# Patient Record
Sex: Female | Born: 1955 | Race: White | Hispanic: No | Marital: Married
Health system: Southern US, Community
[De-identification: ages and names within clinical notes are randomized; demographics above are authoritative.]

---

## 2019-08-05 ENCOUNTER — Encounter (HOSPITAL_COMMUNITY): Payer: Self-pay | Admitting: Emergency Medicine

## 2019-08-05 ENCOUNTER — Emergency Department (HOSPITAL_COMMUNITY)
Admission: EM | Admit: 2019-08-05 | Discharge: 2019-08-05 | Disposition: A | Discharge status: Home / Self Care | Payer: 59 | Attending: Emergency Medicine | Admitting: Emergency Medicine

## 2019-08-05 ENCOUNTER — Emergency Department (HOSPITAL_COMMUNITY): Payer: 59

## 2019-08-05 ENCOUNTER — Other Ambulatory Visit: Payer: Self-pay

## 2019-08-05 DIAGNOSIS — W07XXXA Fall from chair, initial encounter: Secondary | ICD-10-CM | POA: Insufficient documentation

## 2019-08-05 DIAGNOSIS — Y9389 Activity, other specified: Secondary | ICD-10-CM | POA: Insufficient documentation

## 2019-08-05 DIAGNOSIS — S4991XA Unspecified injury of right shoulder and upper arm, initial encounter: Secondary | ICD-10-CM | POA: Diagnosis present

## 2019-08-05 DIAGNOSIS — S42214A Unspecified nondisplaced fracture of surgical neck of right humerus, initial encounter for closed fracture: Secondary | ICD-10-CM | POA: Diagnosis not present

## 2019-08-05 DIAGNOSIS — S42201A Unspecified fracture of upper end of right humerus, initial encounter for closed fracture: Secondary | ICD-10-CM

## 2019-08-05 DIAGNOSIS — Z79899 Other long term (current) drug therapy: Secondary | ICD-10-CM | POA: Insufficient documentation

## 2019-08-05 DIAGNOSIS — Y92009 Unspecified place in unspecified non-institutional (private) residence as the place of occurrence of the external cause: Secondary | ICD-10-CM | POA: Diagnosis not present

## 2019-08-05 DIAGNOSIS — Y999 Unspecified external cause status: Secondary | ICD-10-CM | POA: Diagnosis not present

## 2019-08-05 MED ORDER — OXYCODONE-ACETAMINOPHEN 5-325 MG PO TABS: 1.0000 | ORAL_TABLET | Freq: Three times a day (TID) | ORAL | 0 refills | Status: AC | PRN

## 2019-08-05 MED ORDER — HYDROMORPHONE HCL 1 MG/ML IJ SOLN
1.0000 mg | Freq: Once | INTRAMUSCULAR | Status: AC
  Administered 2019-08-05: 1 mg via INTRAVENOUS
  Filled 2019-08-05: qty 1

## 2019-08-05 NOTE — Discharge Instructions (Addendum)
Take the pain medicine as needed to help with your symptoms. Use the shoulder immobilizer as directed. It is important for you to follow-up with the orthopedist listed below are one that you have seen in the past for further evaluation of this fracture. You can take ibuprofen and apply ice to help with symptoms as well. Return to the ER if you start to experience worsening pain, additional injuries, numbness, worsening swelling.

## 2019-08-05 NOTE — ED Triage Notes (Signed)
Pt BIBA.   Per EMS- Pt was adjusting curtain rod and had a mechanical fall, denies LOC. 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    Ambulatory, AOx4. Pt unable to perform ROM of right arm.  Distal PMS intact. Radiating pain down into hand.   20 g L FA; 100 mcg fentanyl (last given 1755). 450 mL NaCl given en route.

## 2019-08-05 NOTE — ED Provider Notes (Signed)
[facility]-EMERGENCY DEPT Provider Note   CSN: [id number] Arrival date & time: 08/05/19  1751     History Chief Complaint  Patient presents with  . Fall  . Shoulder Injury    right    Chloe Krueger is a 64 y.o. 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 States that she was at her mother in law's house just prior to arrival trying to fix one of the curtains in her home.  She climbed on top of a chair.  States that her feet slipped as she tried to move and she fell and landed on her right shoulder.  She denies any head injury, loss of consciousness.  She has been ambulatory since the fall.  She reports sharp pain in her right shoulder that is worse with movement and palpation.  Reports history of right shoulder pain in the past which improved with physical therapy.  She denies any headache, vision changes, anticoagulant use, back pain, neck pain, chest pain, numbness in arms or legs.  HPI     No past medical history on file.  There are no problems to display for this patient.   OB History   No obstetric history on file.     No family history on file.  Social History   Tobacco Use  . Smoking status: Not on file  Substance Use Topics  . Alcohol use: Not on file  . Drug use: Not on file    Home Medications Prior to Admission medications   Medication Sig Start Date End Date Taking? Authorizing Provider  Multiple Vitamins-Minerals (ONE-A-DAY VITACRAVES IMMUNITY PO) Take 1 tablet by mouth in the morning and at bedtime.   Yes [provider]  triamterene-hydrochlorothiazide (MAXZIDE-25) 37.5-25 MG tablet Take 1 tablet by mouth daily.  05/20/16  Yes [provider]  oxyCODONE-acetaminophen (PERCOCET/ROXICET) 5-325 MG tablet Take 1 tablet by mouth every 8 (eight) hours as needed for severe pain. 08/05/19   Lashika Erker, PA-C     Allergies    Codeine  Review of Systems   Review of Systems  Constitutional: Negative for appetite change, chills and fever.  HENT: Negative for ear pain, rhinorrhea, sneezing and sore throat.   Eyes: Negative for photophobia and visual disturbance.  Respiratory: Negative for cough, chest tightness, shortness of breath and wheezing.   Cardiovascular: Negative for chest pain and palpitations.  Gastrointestinal: Negative for abdominal pain, blood in stool, constipation, diarrhea, nausea and vomiting.  Genitourinary: Negative for dysuria, hematuria and urgency.  Musculoskeletal: Positive for arthralgias. Negative for myalgias.  Skin: Negative for rash.  Neurological: Negative for dizziness, weakness and light-headedness.    Physical Exam Updated Vital Signs BP 133/80 (BP Location: Left Arm)   Pulse 86   Temp 98.1 F (36.7 C) (Oral)   Resp 17   Ht 5\' 5"  (1.651 m)   Wt 88.5 kg   SpO2 99%   BMI 32.45 kg/m   Physical Exam Vitals and nursing note reviewed.  Constitutional:      General: She is not in acute distress.    Appearance: She is well-developed.  HENT:     Head: Normocephalic and atraumatic.     Nose: Nose normal.  Eyes:     General: No scleral icterus.       Left eye: No discharge.     Conjunctiva/sclera: Conjunctivae normal.  Cardiovascular:  Rate and Rhythm: Normal rate and regular rhythm.     Heart sounds: Normal heart sounds. No murmur. No friction rub. No gallop.   Pulmonary:     Effort: Pulmonary effort is normal. No respiratory distress.     Breath sounds: Normal breath sounds.  Abdominal:     General: Bowel sounds are normal. There is no distension.     Palpations: Abdomen is soft.     Tenderness: There is no abdominal tenderness. There is no guarding.  Musculoskeletal:     Right shoulder: Tenderness and bony tenderness present. Decreased range of motion.       Arms:     Cervical back: Normal range of motion and neck supple.     Comments: TTP  of R shoulder with limited ROM. No obvious deformities noted. 2+ radial pulse noted bilaterally. Normal sensation to light touch. FROM of R elbow, wrist and digits.  Skin:    General: Skin is warm and dry.     Findings: No rash.  Neurological:     Mental Status: She is alert.     Motor: No abnormal muscle tone.     Coordination: Coordination normal.     ED Results / Procedures / Treatments   Labs (all labs ordered are listed, but only abnormal results are displayed) Labs Reviewed - No data to display  EKG None  Radiology DG Shoulder Right  Result Date: 08/05/2019 CLINICAL DATA:  Status post fall, shoulder pain EXAM: RIGHT SHOULDER - 2+ VIEW; RIGHT HUMERUS - 2+ VIEW COMPARISON:  None. FINDINGS: Nondisplaced fracture of the surgical neck of the right proximal humerus. No other fracture or dislocation. Soft tissues are unremarkable. IMPRESSION: Nondisplaced fracture of the surgical neck of the right proximal humerus. Electronically Signed   By: Elige Ko   On: 08/05/2019 19:35   DG Humerus Right  Result Date: 08/05/2019 CLINICAL DATA:  Status post fall, shoulder pain EXAM: RIGHT SHOULDER - 2+ VIEW; RIGHT HUMERUS - 2+ VIEW COMPARISON:  None. FINDINGS: Nondisplaced fracture of the surgical neck of the right proximal humerus. No other fracture or dislocation. Soft tissues are unremarkable. IMPRESSION: Nondisplaced fracture of the surgical neck of the right proximal humerus. Electronically Signed   By: Elige Ko   On: 08/05/2019 19:35    Procedures Procedures (including critical care time)  Medications Ordered in ED Medications  HYDROmorphone (DILAUDID) injection 1 mg (1 mg Intravenous Given 08/05/19 1849)    ED Course  I have reviewed the triage vital signs and the nursing notes.  Pertinent labs & imaging results that were available during my care of the patient were reviewed by me and considered in my medical decision making (see chart for details).    MDM  Rules/Calculators/A&P                      64 year old female with a past medical history of Mnire's disease presenting to the ED after mechanical fall that occurred prior to arrival.  Slipped off of a chair while trying to fix the curtain in her mother-in-law's home and landed on her right shoulder.  She has been having pain with movement since then.  On exam there are tender to palpation of the right shoulder and proximal humerus without obvious deformity.  There is neurovascularly intact.  Good pulses noted.  Normal range of motion of elbow, wrist and digits.  No rib tenderness, no C, T or L-spine tenderness on exam.  She reports normal gait and denies  any head injury or loss of consciousness.  X-ray of the right arm and shoulder shows nondisplaced fracture of the right proximal humeral neck.  Patient will be given shoulder immobilizer, short course of pain medication and orthopedic follow-up.  We will have her return for worsening symptoms.  All imaging, if done today, including plain films, CT scans, and ultrasounds, independently reviewed by me, and interpretations confirmed via formal radiology reads.  Patient is hemodynamically stable, in NAD, and able to ambulate in the ED. Evaluation does not show pathology that would require ongoing emergent intervention or inpatient treatment. I explained the diagnosis to the patient. Pain has been managed and has no complaints prior to discharge. Patient is comfortable with above plan and is stable for discharge at this time. All questions were answered prior to disposition. Strict return precautions for returning to the ED were discussed. Encouraged follow up with PCP.   Prior to providing a prescription for a controlled substance, I independently reviewed the patient's recent prescription history on the [facility]. The patient had no recent or regular prescriptions and was deemed appropriate for a brief, less than 3  day prescription of narcotic for acute analgesia.  An After Visit Summary was printed and given to the patient.   Portions of this note were generated with [employer]. Dictation errors may occur despite best attempts at proofreading.  Final Clinical Impression(s) / ED Diagnoses Final diagnoses:  Closed fracture of proximal end of right humerus, unspecified fracture morphology, initial encounter    Rx / DC Orders ED Discharge Orders         Ordered    oxyCODONE-acetaminophen (PERCOCET/ROXICET) 5-325 MG tablet  Every 8 hours PRN     08/05/19 2008           Delia Heady, PA-C 08/05/19 2010    Drenda Freeze, MD 08/07/19 [phone]

## 2021-09-06 IMAGING — CR DG SHOULDER 2+V*R*
3 series · 3 of 3 positions shown · non-contrast
Comparison: None.

CLINICAL DATA: Status post fall, shoulder pain

EXAM:
RIGHT SHOULDER - 2+ VIEW; RIGHT HUMERUS - 2+ VIEW

[x shoulder ap right (1 of 3)]
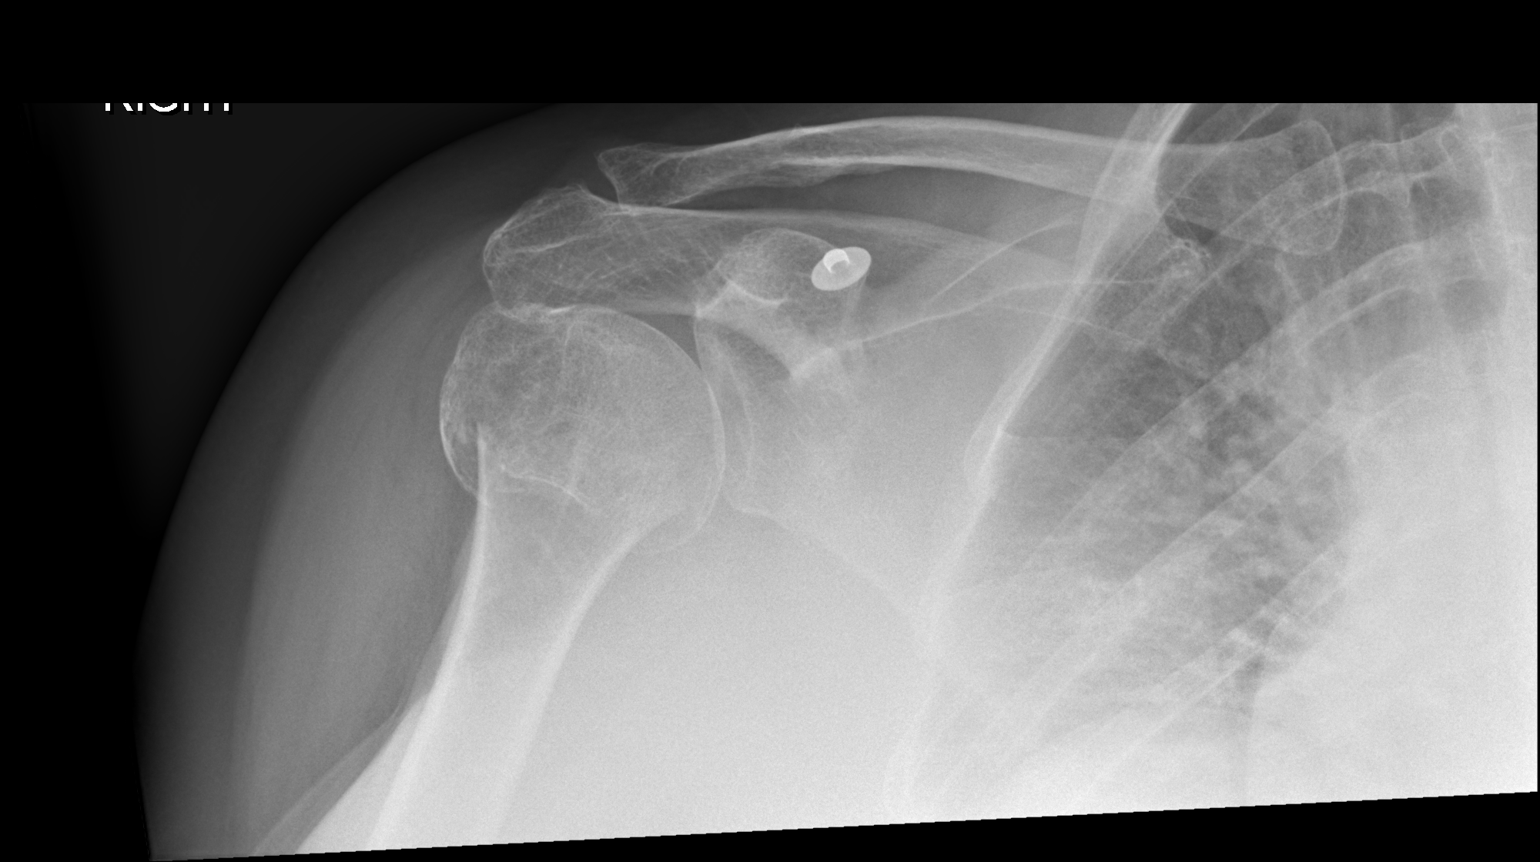

[x shoulder ap right (2 of 3)]
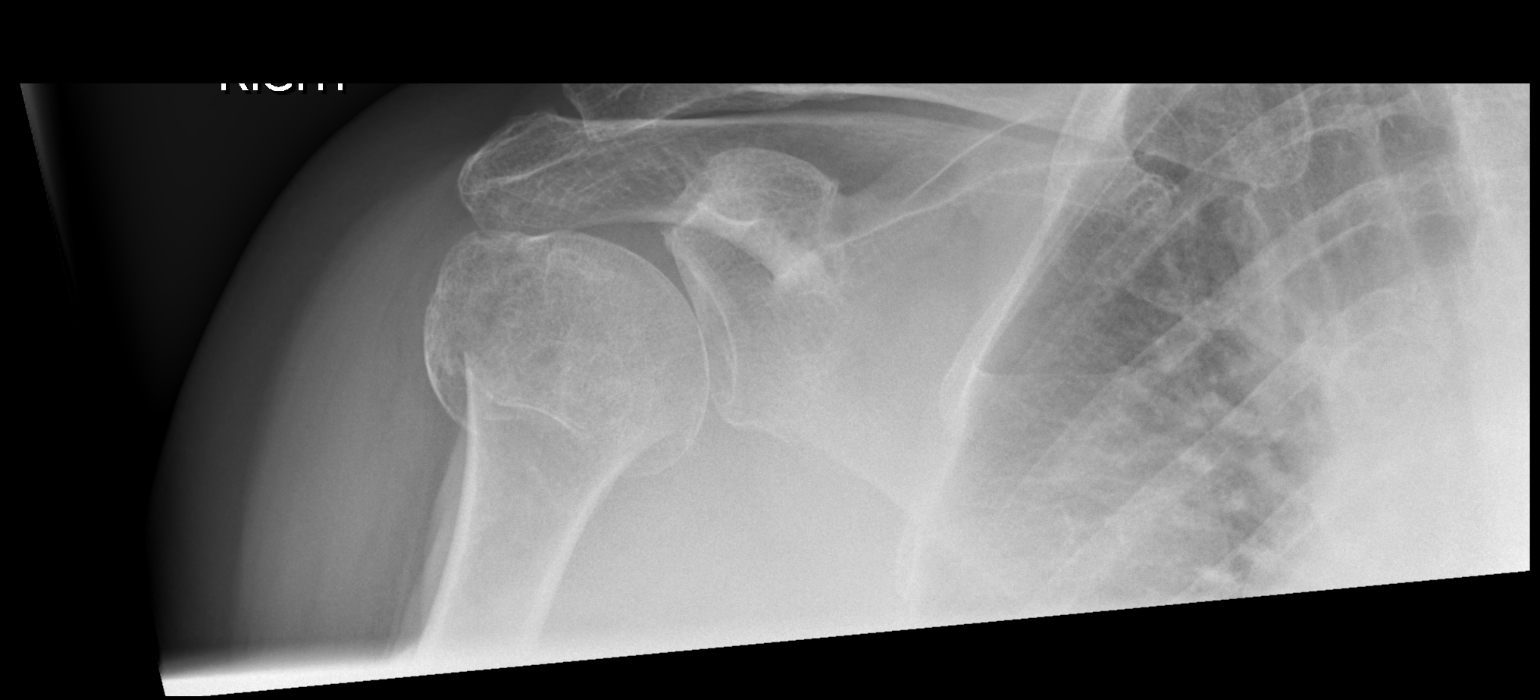

[x shoulder ap right (3 of 3)]
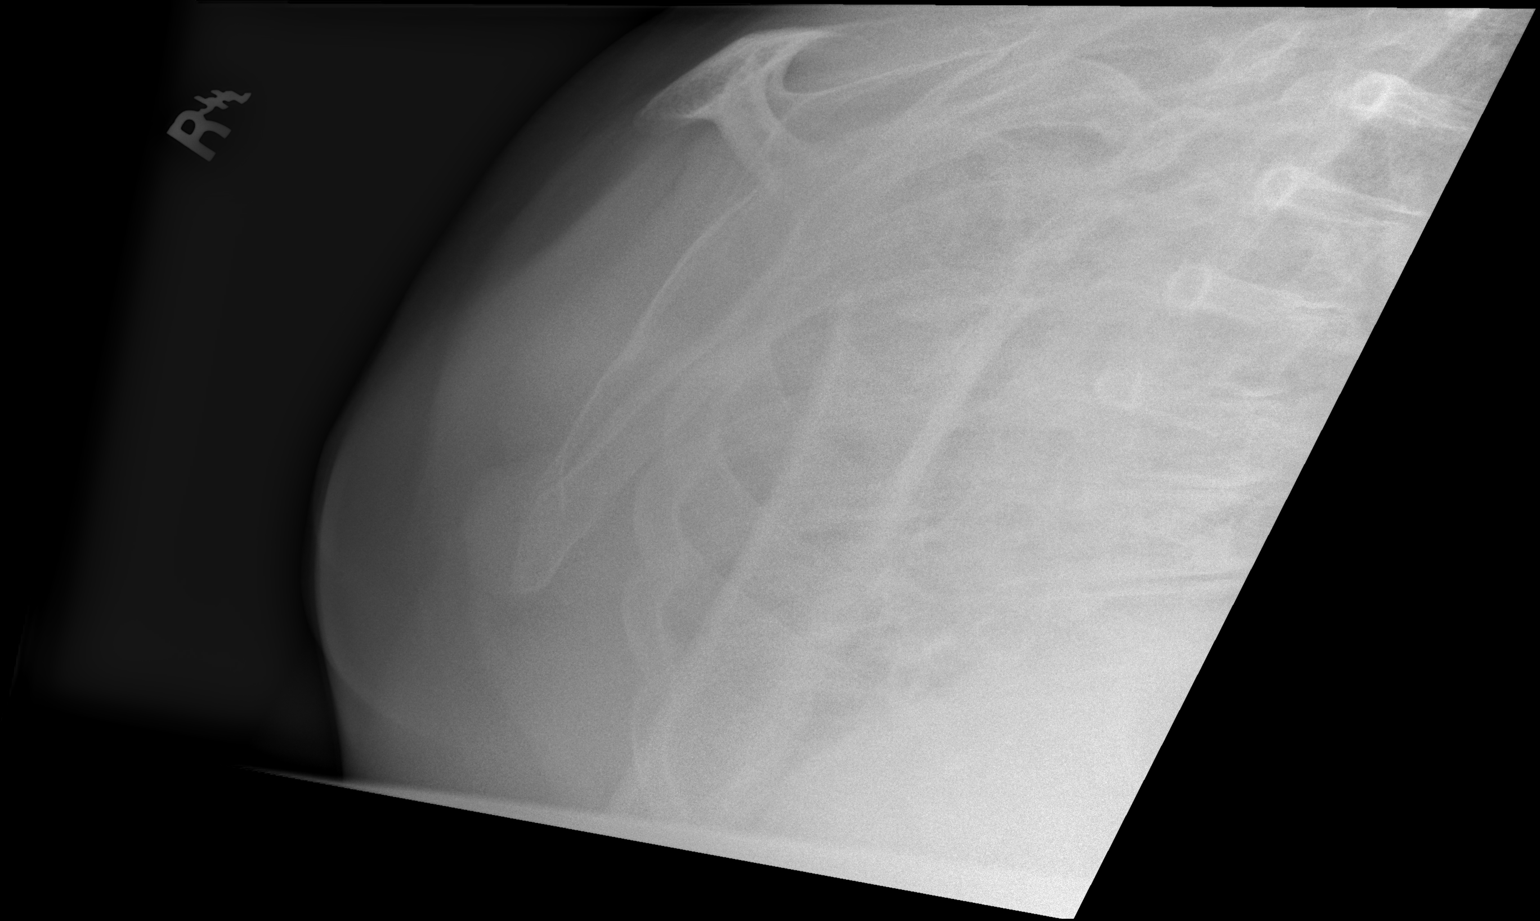

[3 of 3 positions shown; findings below may reference images not displayed]

FINDINGS: Nondisplaced fracture of the surgical neck of the right proximal
humerus. No other fracture or dislocation. Soft tissues are
unremarkable.
IMPRESSION: Nondisplaced fracture of the surgical neck of the right proximal
humerus.

## 2021-09-06 IMAGING — CR DG HUMERUS 2V *R*
3 series · 3 of 3 positions shown · non-contrast
Comparison: None.

CLINICAL DATA: Status post fall, shoulder pain

EXAM:
RIGHT SHOULDER - 2+ VIEW; RIGHT HUMERUS - 2+ VIEW

[x humerus ap right]
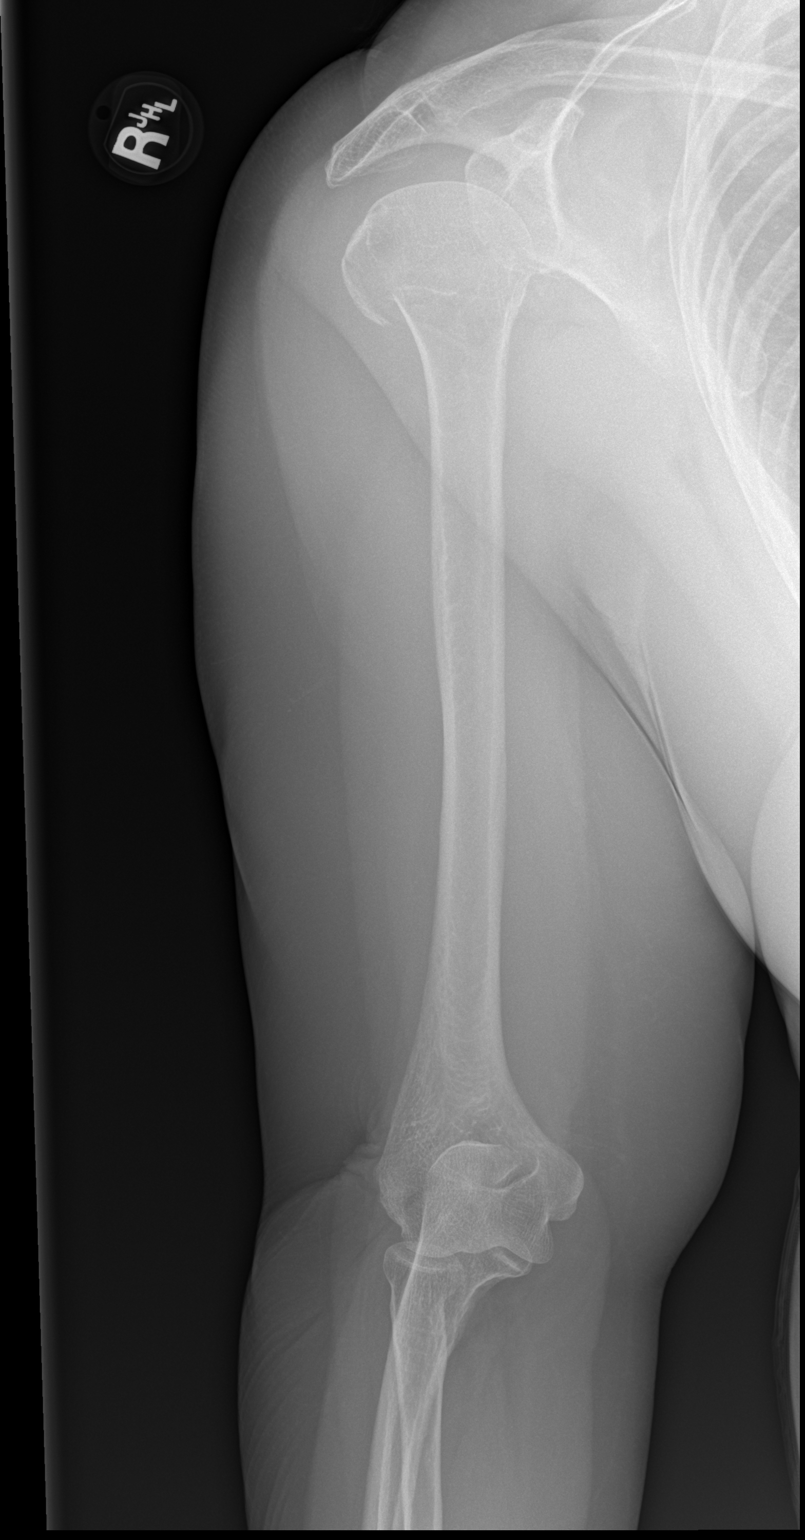

[x humerus lat right (1 of 2)]
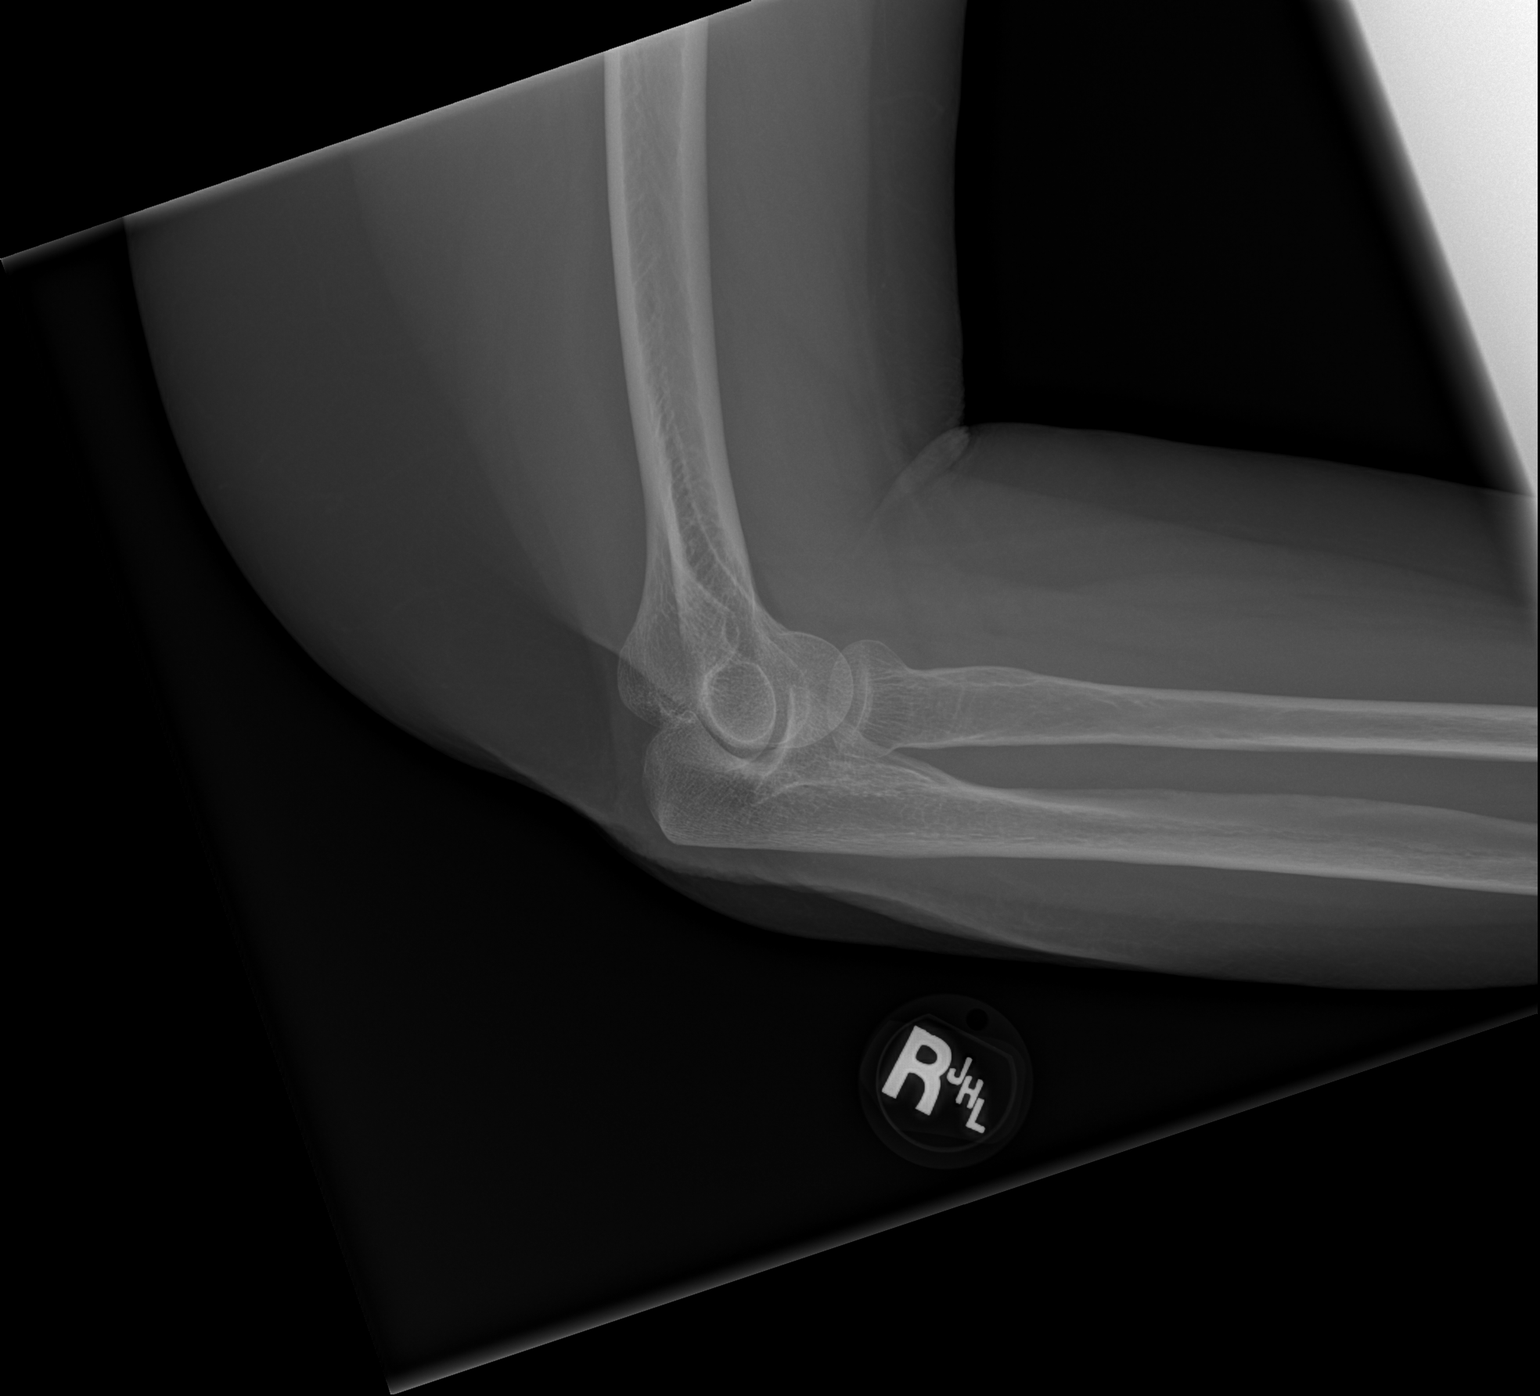

[x humerus lat right (2 of 2)]
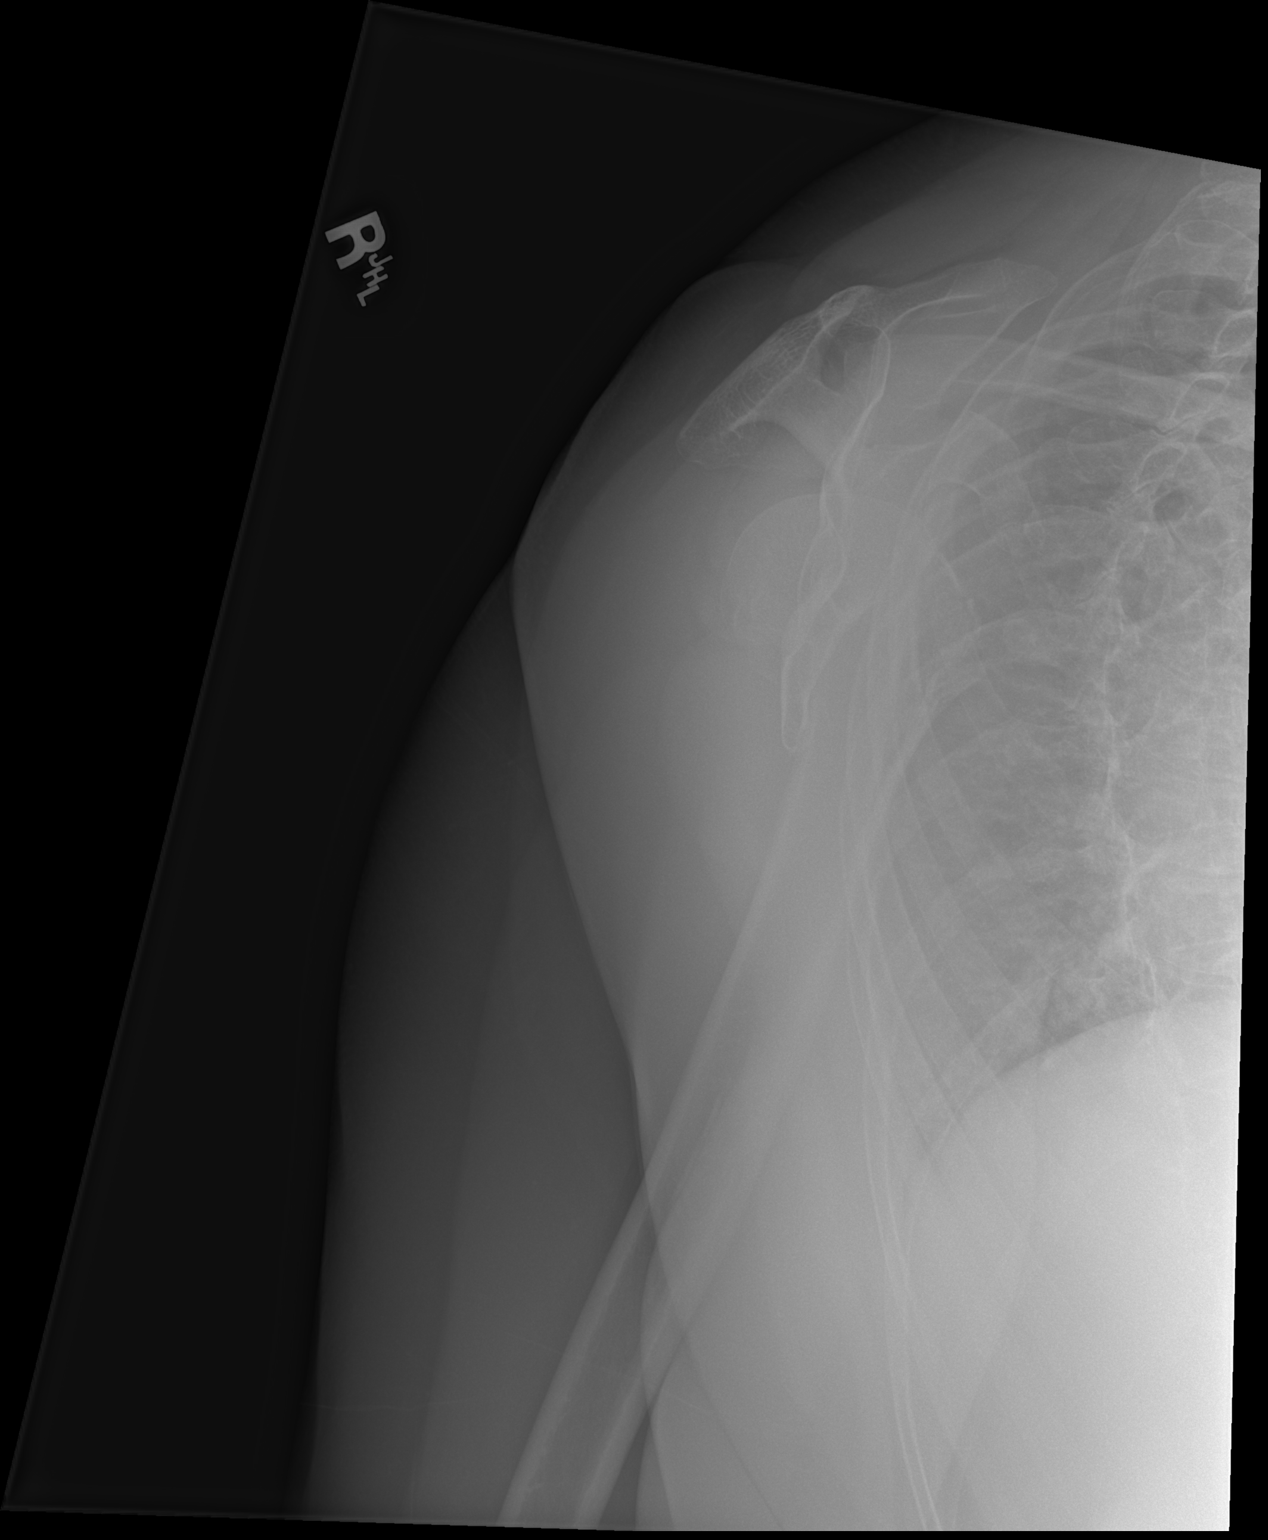

[3 of 3 positions shown; findings below may reference images not displayed]

FINDINGS: Nondisplaced fracture of the surgical neck of the right proximal
humerus. No other fracture or dislocation. Soft tissues are
unremarkable.
IMPRESSION: Nondisplaced fracture of the surgical neck of the right proximal
humerus.
